# Patient Record
Sex: Female | Born: 2000 | Race: White | Hispanic: No | Marital: Single | Smoking: Never smoker
Health system: Southern US, Community
[De-identification: ages and names within clinical notes are randomized; demographics above are authoritative.]

---
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
## 2021-06-02 ENCOUNTER — Other Ambulatory Visit: Payer: Self-pay

## 2021-06-02 DIAGNOSIS — N939 Abnormal uterine and vaginal bleeding, unspecified: Secondary | ICD-10-CM | POA: Diagnosis not present

## 2021-06-02 LAB — CBC WITH DIFFERENTIAL/PLATELET
Abs Immature Granulocytes: 0.02 10*3/uL (ref 0.00–0.07)
Basophils Absolute: 0 10*3/uL (ref 0.0–0.1)
Basophils Relative: 1 %
Eosinophils Absolute: 0.2 10*3/uL (ref 0.0–0.5)
Eosinophils Relative: 2 %
HCT: 37.6 % (ref 36.0–46.0)
Hemoglobin: 12.8 g/dL (ref 12.0–15.0)
Immature Granulocytes: 0 %
Lymphocytes Relative: 35 %
Lymphs Abs: 2.8 10*3/uL (ref 0.7–4.0)
MCH: 30.5 pg (ref 26.0–34.0)
MCHC: 34 g/dL (ref 30.0–36.0)
MCV: 89.5 fL (ref 80.0–100.0)
Monocytes Absolute: 0.6 10*3/uL (ref 0.1–1.0)
Monocytes Relative: 7 %
Neutro Abs: 4.5 10*3/uL (ref 1.7–7.7)
Neutrophils Relative %: 55 %
Platelets: 313 10*3/uL (ref 150–400)
RBC: 4.2 MIL/uL (ref 3.87–5.11)
RDW: 12.4 % (ref 11.5–15.5)
WBC: 8.1 10*3/uL (ref 4.0–10.5)
nRBC: 0 % (ref 0.0–0.2)

## 2021-06-02 LAB — SAMPLE TO BLOOD BANK

## 2021-06-02 LAB — POC URINE PREG, ED: Preg Test, Ur: NEGATIVE

## 2021-06-02 NOTE — ED Triage Notes (Signed)
Pt states is having vaginal bleeding. Pt states she on birth control pills. Pt states she had a period 2 weeks pta. Pt appears in no acute distress.

## 2021-06-03 ENCOUNTER — Emergency Department
Admission: EM | Admit: 2021-06-03 | Discharge: 2021-06-03 | Disposition: A | Discharge status: Home / Self Care | Payer: No Typology Code available for payment source | Attending: Emergency Medicine | Admitting: Emergency Medicine

## 2021-06-03 ENCOUNTER — Emergency Department: Payer: No Typology Code available for payment source

## 2021-06-03 DIAGNOSIS — N939 Abnormal uterine and vaginal bleeding, unspecified: Secondary | ICD-10-CM

## 2021-06-03 NOTE — ED Provider Notes (Signed)
[facility] Provider Note    Event Date/Time   First MD Initiated Contact with Patient 06/03/21 [phone]     (approximate)   History   Vaginal Bleeding   HPI  Kelsey Cohen is a 21 y.o. female with no contributory past medical history who presents for evaluation of vaginal bleeding.  She reports that she is on oral contraceptives and is not currently sexually active.  She used to have very irregular and occasionally heavy periods and her primary care provider started her on oral contraceptive pills.  For about the last 8 or 9 months this is done a good job of regulating her cycle and she has been quite regular and predictable every month.  However she reports that she had a normal cycle about 2 weeks ago but she started bleeding again yesterday with some spotting that has now become heavier bleeding.  She had a few lower abdominal cramps that feel like menstrual cramps but those have resolved.  No nausea or vomiting.  No fever.  No dysuria.  No concern for STDs.     Physical Exam   Triage Vital Signs: ED Triage Vitals  Enc Vitals Group     BP 06/02/21 2227 (!) 149/90     Pulse Rate 06/02/21 2227 77     Resp 06/02/21 2227 15     Temp 06/02/21 2227 98.7 F (37.1 C)     Temp Source 06/02/21 2226 Oral     SpO2 06/02/21 2227 96 %     Weight 06/02/21 2227 55.8 kg (123 lb)     Height 06/02/21 2227 1.727 m (5\' 8" )     Head Circumference --      Peak Flow --      Pain Score --      Pain Loc --      Pain Edu? --      Excl. in GC? --     Most recent vital signs: Vitals:   06/03/21 0153 06/03/21 0338  BP: 116/72 118/75  Pulse: 88 64  Resp: 20 18  Temp:    SpO2: 100%      General: Awake, no distress.  CV:  Good peripheral perfusion.  Resp:  Normal effort.  Abd:  No distention.  No tenderness to palpation. Other:  Deferred pelvic exam based on joint decision making with patient.   ED Results / Procedures / Treatments   Labs (all labs ordered  are listed, but only abnormal results are displayed) Labs Reviewed  CBC WITH DIFFERENTIAL/PLATELET  POC URINE PREG, ED  SAMPLE TO BLOOD BANK      PROCEDURES:  Critical Care performed: No  Procedures   MEDICATIONS ORDERED IN ED: Medications - No data to display   IMPRESSION / MDM / ASSESSMENT AND PLAN / ED COURSE  I reviewed the triage vital signs and the nursing notes.                              Differential diagnosis includes, but is not limited to, Menometrorrhagia, dysfunctional uterine bleeding, complication of early pregnancy, non-specific hormonal imbalance.    Vital signs are stable and within normal limits.  Patient is having no symptoms at this time other than vaginal bleeding.  CBC and urine pregnancy test were ordered in triage.  The urine pregnancy test is negative and the CBC is within normal limits including no evidence of anemia.  Ultrasound was ordered and demonstrates  no acute or emergent findings.  Patient is already on oral contraceptive pills and is not hemorrhaging dangerously.  I provided reassurance and her results and encouraged her to follow-up with her primary care doctor to discuss whether different contraceptive options would be appropriate.  She understands and agrees with the plan.  She would prefer not to have a pelvic exam tonight and I do not think it would make a difference in terms of her evaluation so we agreed together to not pursue a pelvic exam.         FINAL CLINICAL IMPRESSION(S) / ED DIAGNOSES   Final diagnoses:  Vaginal bleeding     Rx / DC Orders   ED Discharge Orders     None        Note:  This document was prepared using Dragon voice recognition software and may include unintentional dictation errors.   Loleta Rose, MD 06/03/21 [phone]

## 2021-06-03 NOTE — Discharge Instructions (Signed)
Your workup in the Emergency Department today was reassuring.  We did not find any specific abnormalities.  We recommend you drink plenty of fluids, take your regular medications and/or any new ones prescribed today, and follow up with the doctor(s) listed in these documents as recommended.  Return to the Emergency Department if you develop new or worsening symptoms that concern you.  

## 2022-09-20 ENCOUNTER — Ambulatory Visit (INDEPENDENT_AMBULATORY_CARE_PROVIDER_SITE_OTHER): Payer: No Typology Code available for payment source

## 2022-09-20 DIAGNOSIS — K2289 Other specified disease of esophagus: Secondary | ICD-10-CM

## 2022-09-20 DIAGNOSIS — K2950 Unspecified chronic gastritis without bleeding: Secondary | ICD-10-CM | POA: Diagnosis not present

## 2022-09-20 DIAGNOSIS — K3189 Other diseases of stomach and duodenum: Secondary | ICD-10-CM

## 2022-09-28 ENCOUNTER — Other Ambulatory Visit: Payer: Self-pay | Admitting: Gastroenterology

## 2022-09-28 DIAGNOSIS — R112 Nausea with vomiting, unspecified: Secondary | ICD-10-CM

## 2022-09-28 DIAGNOSIS — K219 Gastro-esophageal reflux disease without esophagitis: Secondary | ICD-10-CM

## 2022-09-28 DIAGNOSIS — R1111 Vomiting without nausea: Secondary | ICD-10-CM

## 2022-10-12 ENCOUNTER — Encounter
Admission: RE | Admit: 2022-10-12 | Discharge: 2022-10-12 | Disposition: A | Discharge status: Home / Self Care | Payer: No Typology Code available for payment source | Source: Ambulatory Visit | Attending: Gastroenterology | Admitting: Gastroenterology

## 2022-10-12 DIAGNOSIS — R112 Nausea with vomiting, unspecified: Secondary | ICD-10-CM

## 2022-10-12 DIAGNOSIS — K219 Gastro-esophageal reflux disease without esophagitis: Secondary | ICD-10-CM | POA: Insufficient documentation

## 2022-10-12 DIAGNOSIS — R1111 Vomiting without nausea: Secondary | ICD-10-CM | POA: Insufficient documentation

## 2022-10-12 MED ORDER — TECHNETIUM TC 99M SULFUR COLLOID
2.1900 | Freq: Once | INTRAVENOUS | Status: AC | PRN
  Administered 2022-10-12: 2.19 via INTRAVENOUS

## 2022-10-30 ENCOUNTER — Other Ambulatory Visit: Payer: Self-pay | Admitting: Gastroenterology

## 2022-10-30 DIAGNOSIS — R1111 Vomiting without nausea: Secondary | ICD-10-CM

## 2022-10-31 ENCOUNTER — Other Ambulatory Visit: Payer: Self-pay | Admitting: Gastroenterology

## 2022-10-31 DIAGNOSIS — K219 Gastro-esophageal reflux disease without esophagitis: Secondary | ICD-10-CM

## 2022-10-31 DIAGNOSIS — R1111 Vomiting without nausea: Secondary | ICD-10-CM

## 2024-01-11 IMAGING — US US PELVIS COMPLETE TRANSABD/TRANSVAG W DUPLEX AND/OR DOPPLER
2 series · 13 of 25 positions shown · non-contrast
Comparison: None available.

CLINICAL DATA: Initial evaluation for acute vaginal bleeding.

EXAM:
TRANSABDOMINAL AND TRANSVAGINAL ULTRASOUND OF PELVIS
DOPPLER ULTRASOUND OF OVARIES
TECHNIQUE: Both transabdominal and transvaginal ultrasound examinations of the
pelvis were performed. Transabdominal technique was performed for
global imaging of the pelvis including uterus, ovaries, adnexal
regions, and pelvic cul-de-sac.
It was necessary to proceed with endovaginal exam following the
transabdominal exam to visualize the endometrium and ovaries. Color
and duplex Doppler ultrasound was utilized to evaluate blood flow to
the ovaries.

[Series 1: us pelvic complete w transvaginal and torsion righ · 12 of 51 slices shown]
[im 1/51]
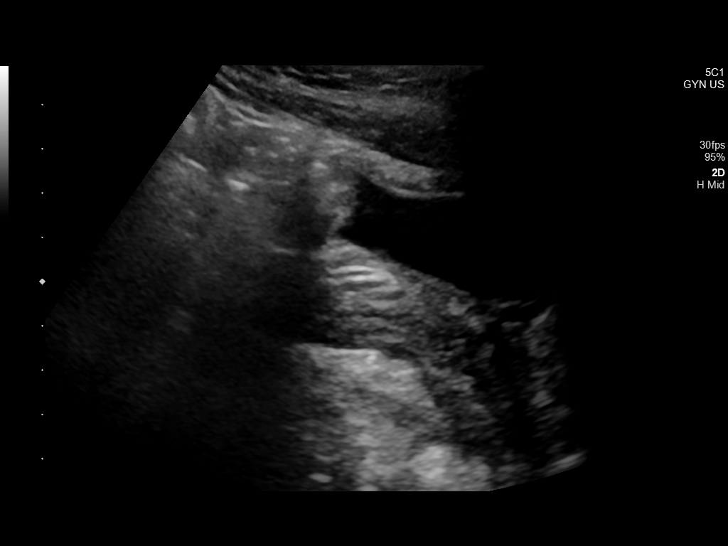
[im 5/51]
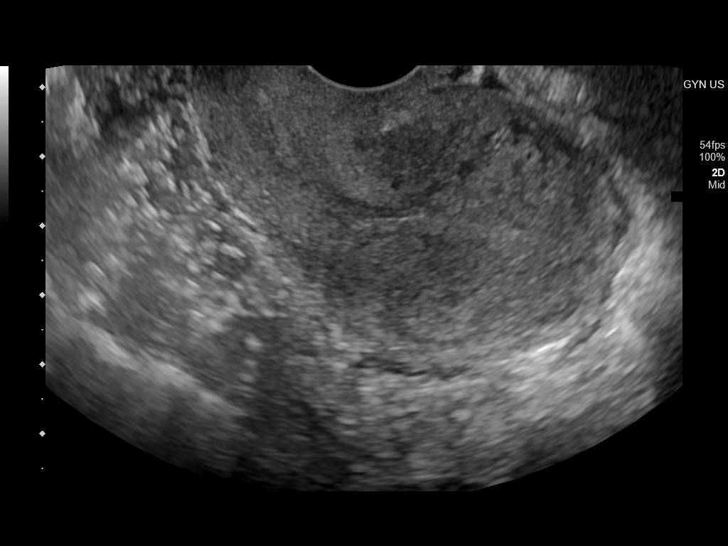
[im 9/51]
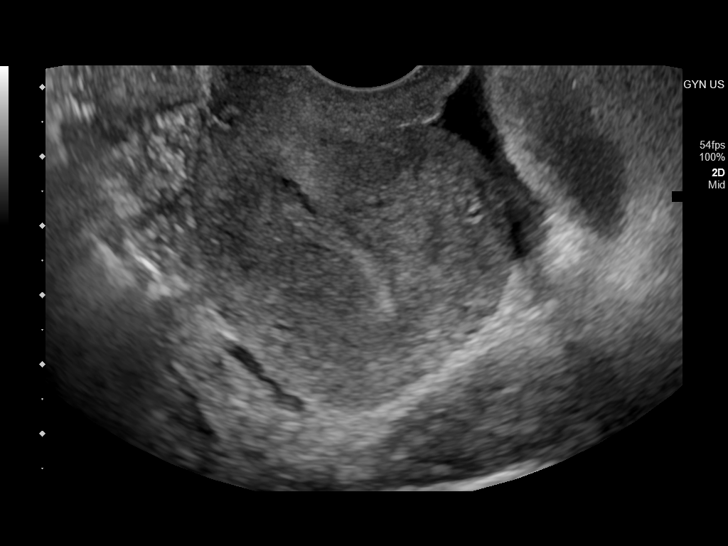
[im 14/51]
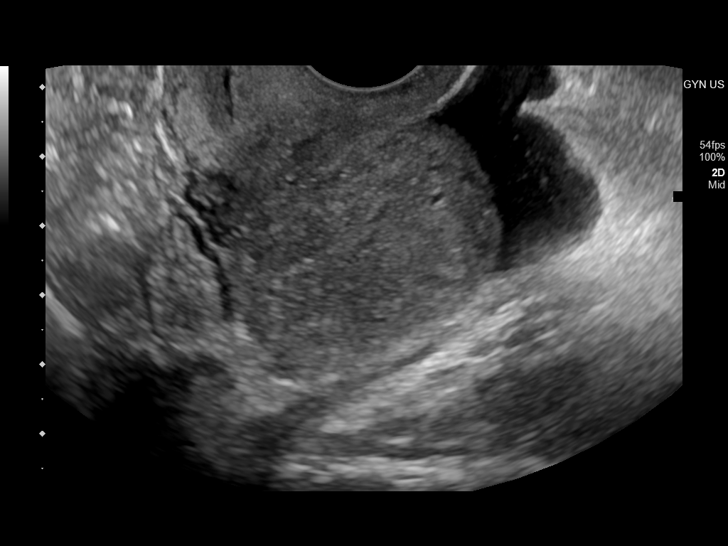
[im 18/51]
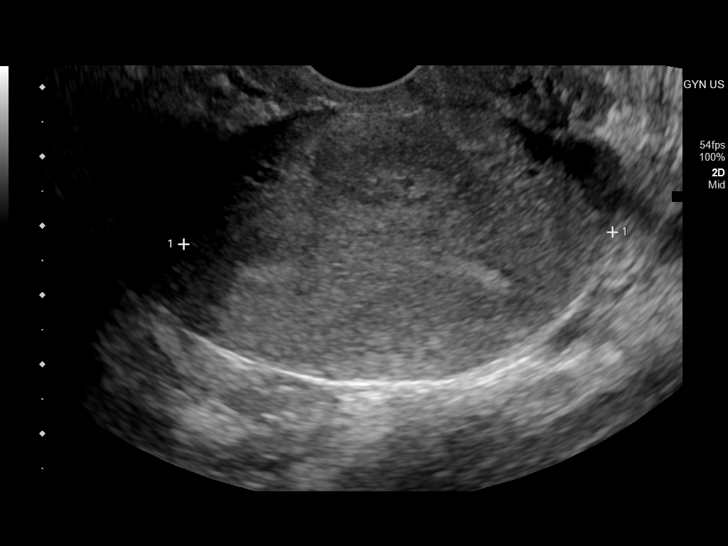
[im 22/51]
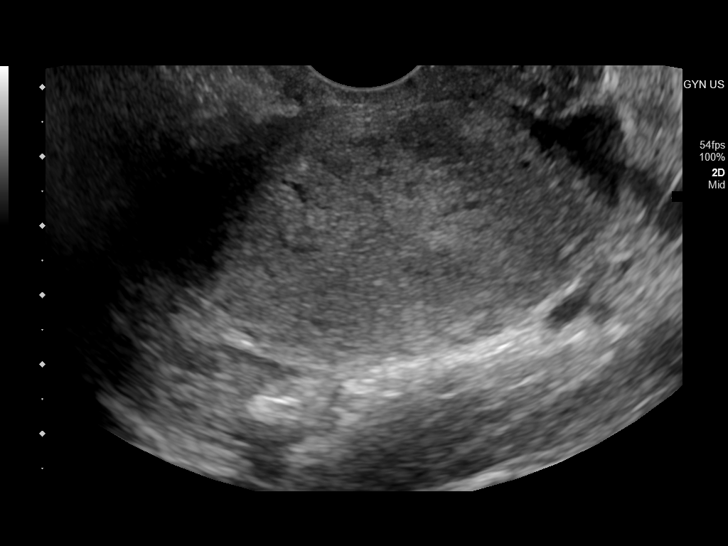
[im 27/51]
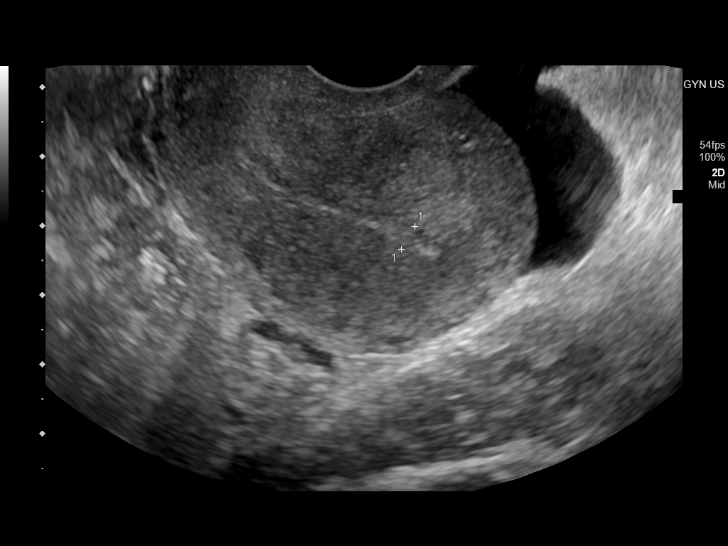
[im 31/51]
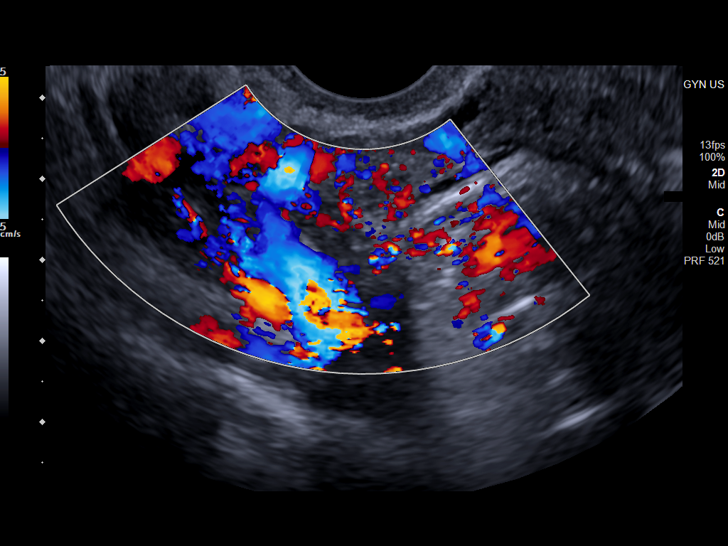
[im 35/51]
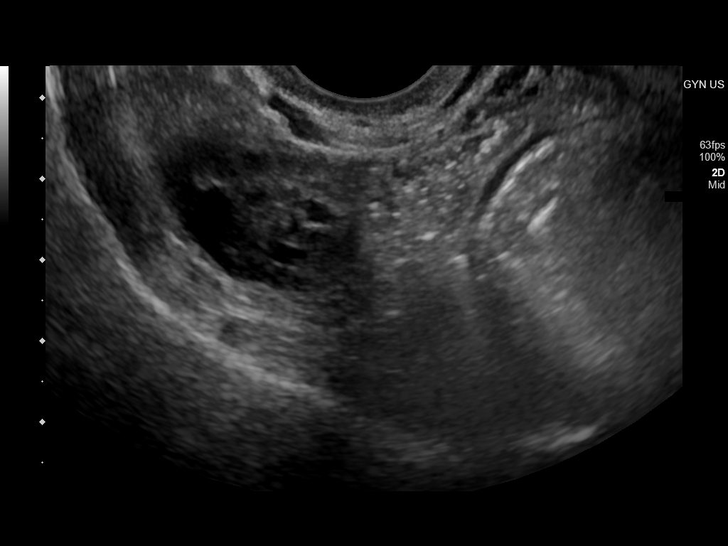
[im 40/51]
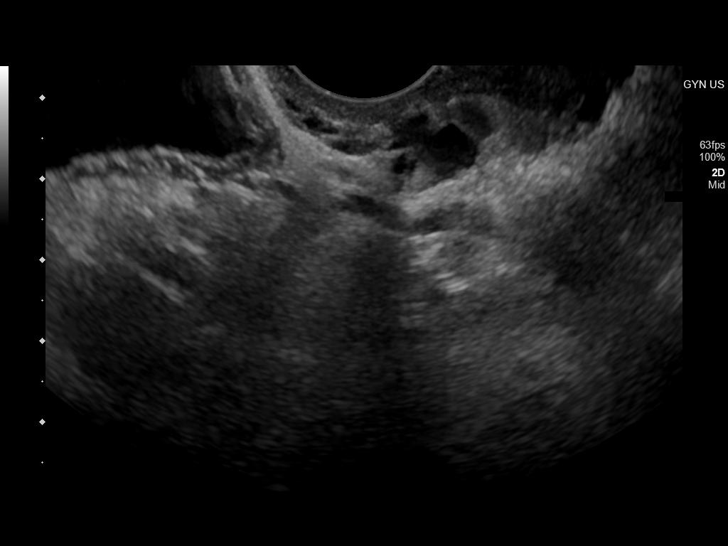
[im 44/51]
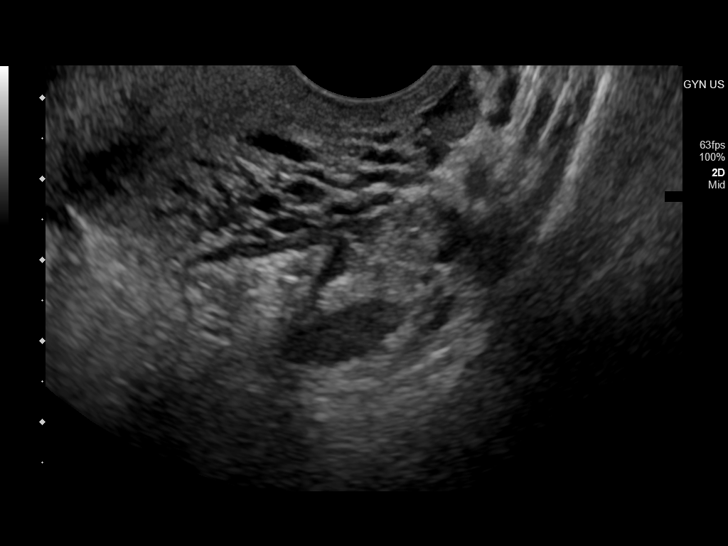
[im 48/51]
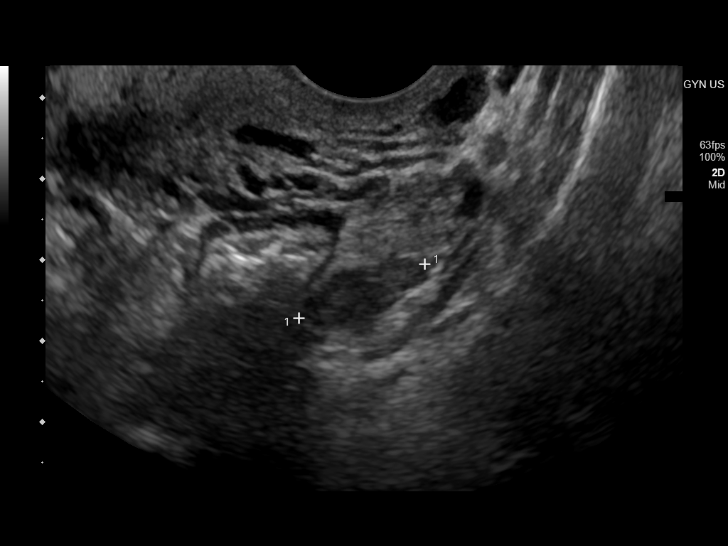

[Series 1001: gyn us · 1 of 2 slices shown]
[im 1/2]
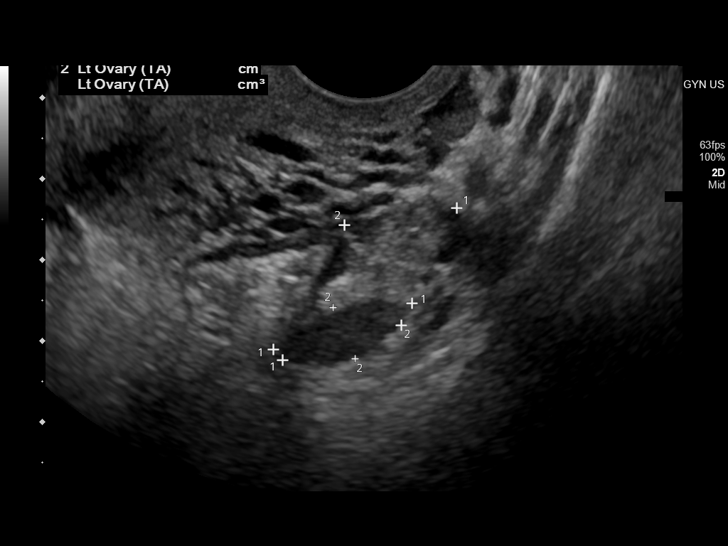

[13 of 25 positions shown; findings below may reference images not displayed]

FINDINGS: Uterus

Measurements: 6.7 x 4.2 x 6.2 cm = volume: 90.5 mL. Uterus is
retroverted. No discrete fibroid or other myometrial abnormality.

Endometrium

Thickness: 4 mm.  No focal abnormality visualized.

Right ovary

Measurements: 2.6 x 1.2 x 2.4 cm = volume: 3.8 mL. Normal
appearance/no adnexal mass.

Left ovary

Measurements: 2.8 x 1.9 x 2.7 cm = volume: 1.1 mL. Normal
appearance/no adnexal mass.

Pulsed Doppler evaluation of both ovaries demonstrates normal
low-resistance arterial and venous waveforms.

Other findings

Small volume free fluid seen within the pelvis, presumably
physiologic.
IMPRESSION: 1. Small volume free fluid within the pelvis, presumably
physiologic.
2. Otherwise unremarkable and normal pelvic ultrasound. No evidence
for torsion or other acute abnormality.
3. Endometrial stripe within normal limits measuring 4 mm in
thickness. If bleeding remains unresponsive to hormonal or medical
therapy, sonohysterogram should be considered for focal lesion
work-up. (Ref: Radiological Reasoning: Algorithmic Workup of
Abnormal Vaginal Bleeding with Endovaginal Sonography and
Sonohysterography. AJR 2005; 191:S68-73).
